# Patient Record
Sex: Female | Born: 1970 | Race: White | Hispanic: No | Marital: Single | Smoking: Never smoker
Health system: Southern US, Community
[De-identification: ages and names within clinical notes are randomized; demographics above are authoritative.]

---

## 2019-08-22 ENCOUNTER — Ambulatory Visit: Payer: Self-pay

## 2019-09-16 ENCOUNTER — Ambulatory Visit: Payer: Self-pay

## 2020-02-06 ENCOUNTER — Other Ambulatory Visit: Payer: Self-pay | Admitting: Family Medicine

## 2020-02-06 DIAGNOSIS — Z1231 Encounter for screening mammogram for malignant neoplasm of breast: Secondary | ICD-10-CM

## 2020-03-16 ENCOUNTER — Ambulatory Visit
Admission: RE | Admit: 2020-03-16 | Discharge: 2020-03-16 | Disposition: A | Discharge status: Home / Self Care | Payer: BC Managed Care – PPO | Source: Ambulatory Visit | Attending: Family Medicine | Admitting: Family Medicine

## 2020-03-16 ENCOUNTER — Other Ambulatory Visit: Payer: Self-pay

## 2020-03-16 DIAGNOSIS — Z1231 Encounter for screening mammogram for malignant neoplasm of breast: Secondary | ICD-10-CM

## 2020-03-31 ENCOUNTER — Emergency Department: Payer: BC Managed Care – PPO

## 2020-03-31 ENCOUNTER — Encounter: Payer: Self-pay | Admitting: Emergency Medicine

## 2020-03-31 ENCOUNTER — Other Ambulatory Visit: Payer: Self-pay

## 2020-03-31 ENCOUNTER — Emergency Department
Admission: EM | Admit: 2020-03-31 | Discharge: 2020-04-01 | Disposition: A | Discharge status: Home / Self Care | Payer: BC Managed Care – PPO | Attending: Emergency Medicine | Admitting: Emergency Medicine

## 2020-03-31 DIAGNOSIS — M542 Cervicalgia: Secondary | ICD-10-CM | POA: Diagnosis not present

## 2020-03-31 DIAGNOSIS — M25511 Pain in right shoulder: Secondary | ICD-10-CM | POA: Diagnosis not present

## 2020-03-31 DIAGNOSIS — R0789 Other chest pain: Secondary | ICD-10-CM | POA: Insufficient documentation

## 2020-03-31 DIAGNOSIS — M79601 Pain in right arm: Secondary | ICD-10-CM | POA: Diagnosis not present

## 2020-03-31 DIAGNOSIS — R079 Chest pain, unspecified: Secondary | ICD-10-CM

## 2020-03-31 LAB — BASIC METABOLIC PANEL
Anion gap: 9 (ref 5–15)
BUN: 16 mg/dL (ref 6–20)
CO2: 27 mmol/L (ref 22–32)
Calcium: 8.8 mg/dL — ABNORMAL LOW (ref 8.9–10.3)
Chloride: 103 mmol/L (ref 98–111)
Creatinine, Ser: 1.06 mg/dL — ABNORMAL HIGH (ref 0.44–1.00)
GFR, Estimated: >60 mL/min (ref >60)
Glucose, Bld: 122 mg/dL — ABNORMAL HIGH (ref 70–99)
Potassium: 4 mmol/L (ref 3.5–5.1)
Sodium: 139 mmol/L (ref 135–145)

## 2020-03-31 LAB — CBC
HCT: 39.8 % (ref 36.0–46.0)
Hemoglobin: 13.2 g/dL (ref 12.0–15.0)
MCH: 29.7 pg (ref 26.0–34.0)
MCHC: 33.2 g/dL (ref 30.0–36.0)
MCV: 89.6 fL (ref 80.0–100.0)
Platelets: 249 10*3/uL (ref 150–400)
RBC: 4.44 MIL/uL (ref 3.87–5.11)
RDW: 14 % (ref 11.5–15.5)
WBC: 8.5 10*3/uL (ref 4.0–10.5)
nRBC: 0 % (ref 0.0–0.2)

## 2020-03-31 LAB — TROPONIN I (HIGH SENSITIVITY): Troponin I (High Sensitivity): 3 ng/L (ref <18)

## 2020-03-31 NOTE — ED Provider Notes (Signed)
[facility] Emergency Department Provider Note   ____________________________________________   Event Date/Time   First MD Initiated Contact with Patient 03/31/20 2308     (approximate)  I have reviewed the triage vital signs and the nursing notes.   HISTORY  Chief Complaint Chest Pain    HPI Diane Santiago is a 49 y.o. female with no significant past medical history presents to the ED complaining of chest pain. Patient reports that she has been feeling malaised for the past couple of days after receiving her COVID-19 booster 3 days ago. She reports some pain in her right arm as well as along her right shoulder and into the right side of her neck. She noticed some swelling there but denies any rashes. Earlier tonight, she developed pain in the right side of her chest, which she states is worse when she takes a deep breath. This is been associated with some mild difficulty breathing, but she denies any fevers or cough. She had some diarrhea earlier this week, denies any abdominal pain or vomiting.        History reviewed. No pertinent past medical history.  There are no problems to display for this patient.   History reviewed. No pertinent surgical history.  Prior to Admission medications   Not on File    Allergies Sulfur  Family History  Problem Relation Age of Onset   Breast cancer Cousin     Social History Social History   Tobacco Use   Smoking status: Never Smoker   Smokeless tobacco: Never Used  Substance Use Topics   Alcohol use: Never    Comment: rarely   Drug use: Never    Review of Systems  Constitutional: No fever/chills Eyes: No visual changes. ENT: No sore throat. Cardiovascular: Positive for chest pain. Respiratory: Positive for shortness of breath. Gastrointestinal: No abdominal pain.  No nausea, no vomiting.  No diarrhea.  No constipation. Genitourinary: Negative for dysuria. Musculoskeletal: Negative for  back pain. Skin: Negative for rash. Neurological: Negative for headaches, focal weakness or numbness.  ____________________________________________   PHYSICAL EXAM:  VITAL SIGNS: ED Triage Vitals  Enc Vitals Group     BP 03/31/20 2111 (!) 148/77     Pulse Rate 03/31/20 2111 79     Resp 03/31/20 2111 16     Temp 03/31/20 2111 98.7 F (37.1 C)     Temp Source 03/31/20 2111 Oral     SpO2 03/31/20 2111 99 %     Weight 03/31/20 2112 268 lb (121.6 kg)     Height 03/31/20 2112 5\' 3"  (1.6 m)     Head Circumference --      Peak Flow --      Pain Score 03/31/20 2112 3     Pain Loc --      Pain Edu? --      Excl. in GC? --     Constitutional: Alert and oriented. Eyes: Conjunctivae are normal. Head: Atraumatic. Nose: No congestion/rhinnorhea. Mouth/Throat: Mucous membranes are moist. Neck: Normal ROM. No midline cervical spine or lateral neck tenderness. No cervical lymphadenopathy noted. Cardiovascular: Normal rate, regular rhythm. Grossly normal heart sounds. Respiratory: Normal respiratory effort.  No retractions. Lungs CTAB. No chest wall tenderness to palpation. Gastrointestinal: Soft and nontender. No distention. Genitourinary: deferred Musculoskeletal: No lower extremity tenderness nor edema. Neurologic:  Normal speech and language. No gross focal neurologic deficits are appreciated. Skin:  Skin is warm, dry and intact. No rash noted. Psychiatric: Mood and affect are  normal. Speech and behavior are normal.  ____________________________________________   LABS (all labs ordered are listed, but only abnormal results are displayed)  Labs Reviewed  BASIC METABOLIC PANEL - Abnormal; Notable for the following components:      Result Value   Glucose, Bld 122 (*)    Creatinine, Ser 1.06 (*)    Calcium 8.8 (*)    All other components within normal limits  FIBRIN DERIVATIVES D-DIMER ([facility] ONLY) - Abnormal; Notable for the following components:   Fibrin derivatives D-dimer  ([facility]) 524.72 (*)    All other components within normal limits  CBC  POC URINE PREG, ED  TROPONIN I (HIGH SENSITIVITY)  TROPONIN I (HIGH SENSITIVITY)   ____________________________________________  EKG  ED ECG REPORT I, Chesley Noon, the attending physician, personally viewed and interpreted this ECG.   Date: 03/31/2020  EKG Time: 21:00  Rate: 75  Rhythm: normal sinus rhythm  Axis: Normal  Intervals:none  ST&T Change: None   PROCEDURES  Procedure(s) performed (including Critical Care):  Procedures   ____________________________________________   INITIAL IMPRESSION / ASSESSMENT AND PLAN / ED COURSE       49 year old female with no significant past medical history presents to the ED complaining of a couple of days of malaise with right arm pain after receiving her COVID-19 booster, subsequently developed pleuritic chest pain this evening. Right side of her neck is normal in appearance with no tenderness. EKG shows no evidence of arrhythmia or ischemia and initial troponin is negative. Symptoms are atypical for ACS but could represent PE. She is low risk by Wells and we will further assess with D-dimer. Chest x-ray reviewed by me and shows no infiltrate, edema, or effusion. If work-up for PE is negative, symptoms are likely musculoskeletal in origin.  CTA negative for PE or other acute process.  Patient is appropriate for discharge home with PCP follow-up.  Patient was counseled to return to the ED for new or worsening symptoms, patient agrees with plan.      ____________________________________________   FINAL CLINICAL IMPRESSION(S) / ED DIAGNOSES  Final diagnoses:  Nonspecific chest pain     ED Discharge Orders    None       Note:  This document was prepared using Dragon voice recognition software and may include unintentional dictation errors.   Chesley Noon, MD 04/01/20 [phone]

## 2020-03-31 NOTE — ED Triage Notes (Signed)
Pt to ED from home c/o right side chest pain radiating to right neck, some SOB.  States got COVID booster Sunday and since then has noticed symptoms along with some swelling to right shoulder.  Patient states diarrhea times a couple episodes, denies n/v or urinary symptoms.  Patient A&Ox4, chest rise even and unlabored, skin WNL, in NAD at this time.

## 2020-04-01 ENCOUNTER — Emergency Department: Payer: BC Managed Care – PPO

## 2020-04-01 LAB — TROPONIN I (HIGH SENSITIVITY): Troponin I (High Sensitivity): 3 ng/L (ref <18)

## 2020-04-01 LAB — FIBRIN DERIVATIVES D-DIMER (ARMC ONLY): Fibrin derivatives D-dimer (ARMC): 524.72 ng/mL (FEU) — ABNORMAL HIGH (ref 0.00–499.00)

## 2020-04-01 MED ORDER — IOHEXOL 350 MG/ML SOLN
100.0000 mL | Freq: Once | INTRAVENOUS | Status: AC | PRN
  Administered 2020-04-01: 02:00:00 100 mL via INTRAVENOUS

## 2021-02-09 ENCOUNTER — Other Ambulatory Visit: Payer: Self-pay | Admitting: Family Medicine

## 2021-02-09 DIAGNOSIS — Z1231 Encounter for screening mammogram for malignant neoplasm of breast: Secondary | ICD-10-CM

## 2021-03-17 ENCOUNTER — Ambulatory Visit
Admission: RE | Admit: 2021-03-17 | Discharge: 2021-03-17 | Disposition: A | Discharge status: Home / Self Care | Payer: BC Managed Care – PPO | Source: Ambulatory Visit | Attending: Family Medicine | Admitting: Family Medicine

## 2021-03-17 ENCOUNTER — Other Ambulatory Visit: Payer: Self-pay

## 2021-03-17 DIAGNOSIS — Z1231 Encounter for screening mammogram for malignant neoplasm of breast: Secondary | ICD-10-CM | POA: Diagnosis present

## 2021-03-23 ENCOUNTER — Other Ambulatory Visit: Payer: Self-pay | Admitting: Family Medicine

## 2021-03-23 DIAGNOSIS — N6320 Unspecified lump in the left breast, unspecified quadrant: Secondary | ICD-10-CM

## 2021-03-23 DIAGNOSIS — R928 Other abnormal and inconclusive findings on diagnostic imaging of breast: Secondary | ICD-10-CM

## 2021-04-06 ENCOUNTER — Ambulatory Visit
Admission: RE | Admit: 2021-04-06 | Discharge: 2021-04-06 | Disposition: A | Discharge status: Home / Self Care | Payer: BC Managed Care – PPO | Source: Ambulatory Visit | Attending: Family Medicine | Admitting: Family Medicine

## 2021-04-06 ENCOUNTER — Other Ambulatory Visit: Payer: Self-pay

## 2021-04-06 DIAGNOSIS — N6320 Unspecified lump in the left breast, unspecified quadrant: Secondary | ICD-10-CM

## 2021-04-06 DIAGNOSIS — R928 Other abnormal and inconclusive findings on diagnostic imaging of breast: Secondary | ICD-10-CM | POA: Insufficient documentation

## 2021-04-07 ENCOUNTER — Other Ambulatory Visit: Payer: Self-pay | Admitting: Family Medicine

## 2021-04-07 DIAGNOSIS — R928 Other abnormal and inconclusive findings on diagnostic imaging of breast: Secondary | ICD-10-CM

## 2021-04-07 DIAGNOSIS — N6320 Unspecified lump in the left breast, unspecified quadrant: Secondary | ICD-10-CM

## 2021-04-21 ENCOUNTER — Ambulatory Visit
Admission: RE | Admit: 2021-04-21 | Discharge: 2021-04-21 | Disposition: A | Discharge status: Home / Self Care | Payer: BC Managed Care – PPO | Source: Ambulatory Visit | Attending: Family Medicine | Admitting: Family Medicine

## 2021-04-21 ENCOUNTER — Other Ambulatory Visit: Payer: Self-pay

## 2021-04-21 DIAGNOSIS — R928 Other abnormal and inconclusive findings on diagnostic imaging of breast: Secondary | ICD-10-CM | POA: Insufficient documentation

## 2021-04-21 DIAGNOSIS — N6320 Unspecified lump in the left breast, unspecified quadrant: Secondary | ICD-10-CM | POA: Diagnosis present

## 2021-04-21 HISTORY — PX: MM BREAST STEREO BIOPSY LEFT (ARMC HX): HXRAD1824

## 2021-04-22 LAB — SURGICAL PATHOLOGY

## 2023-04-05 ENCOUNTER — Other Ambulatory Visit: Payer: Self-pay | Admitting: Family Medicine

## 2023-04-05 DIAGNOSIS — Z1231 Encounter for screening mammogram for malignant neoplasm of breast: Secondary | ICD-10-CM

## 2023-04-26 ENCOUNTER — Ambulatory Visit
Admission: RE | Admit: 2023-04-26 | Discharge: 2023-04-26 | Disposition: A | Discharge status: Home / Self Care | Payer: BLUE CROSS/BLUE SHIELD | Source: Ambulatory Visit | Attending: Family Medicine | Admitting: Family Medicine

## 2023-04-26 DIAGNOSIS — Z1231 Encounter for screening mammogram for malignant neoplasm of breast: Secondary | ICD-10-CM | POA: Insufficient documentation

## 2023-05-07 ENCOUNTER — Other Ambulatory Visit: Payer: Self-pay

## 2023-05-07 ENCOUNTER — Emergency Department: Payer: BLUE CROSS/BLUE SHIELD

## 2023-05-07 DIAGNOSIS — R0789 Other chest pain: Secondary | ICD-10-CM | POA: Diagnosis present

## 2023-05-07 DIAGNOSIS — R110 Nausea: Secondary | ICD-10-CM | POA: Insufficient documentation

## 2023-05-07 LAB — BASIC METABOLIC PANEL
Anion gap: 9 (ref 5–15)
BUN: 18 mg/dL (ref 6–20)
CO2: 26 mmol/L (ref 22–32)
Calcium: 9.5 mg/dL (ref 8.9–10.3)
Chloride: 103 mmol/L (ref 98–111)
Creatinine, Ser: 0.87 mg/dL (ref 0.44–1.00)
GFR, Estimated: >60 mL/min (ref >60)
Glucose, Bld: 121 mg/dL — ABNORMAL HIGH (ref 70–99)
Potassium: 3.7 mmol/L (ref 3.5–5.1)
Sodium: 138 mmol/L (ref 135–145)

## 2023-05-07 LAB — CBC
HCT: 33.9 % — ABNORMAL LOW (ref 36.0–46.0)
Hemoglobin: 10.7 g/dL — ABNORMAL LOW (ref 12.0–15.0)
MCH: 25.5 pg — ABNORMAL LOW (ref 26.0–34.0)
MCHC: 31.6 g/dL (ref 30.0–36.0)
MCV: 80.9 fL (ref 80.0–100.0)
Platelets: 272 10*3/uL (ref 150–400)
RBC: 4.19 MIL/uL (ref 3.87–5.11)
RDW: 16.4 % — ABNORMAL HIGH (ref 11.5–15.5)
WBC: 8.8 10*3/uL (ref 4.0–10.5)
nRBC: 0 % (ref 0.0–0.2)

## 2023-05-07 LAB — TROPONIN I (HIGH SENSITIVITY): Troponin I (High Sensitivity): 6 ng/L (ref <18)

## 2023-05-07 LAB — HEPATIC FUNCTION PANEL
ALT: 17 U/L (ref 0–44)
AST: 17 U/L (ref 15–41)
Albumin: 3.5 g/dL (ref 3.5–5.0)
Alkaline Phosphatase: 87 U/L (ref 38–126)
Bilirubin, Direct: <0.1 mg/dL (ref 0.0–0.2)
Total Bilirubin: 0.3 mg/dL (ref 0.0–1.2)
Total Protein: 7.2 g/dL (ref 6.5–8.1)

## 2023-05-07 LAB — LIPASE, BLOOD: Lipase: 37 U/L (ref 11–51)

## 2023-05-07 NOTE — ED Triage Notes (Addendum)
Pt reports intermittent chest tightness that began 3 days ago, pt states she is also having tingling sensation in her chest. Pt states she has had headache and nausea associated with pain . Pt denies cough congestion fever. Pt denies cardiac hx.

## 2023-05-08 ENCOUNTER — Emergency Department
Admission: EM | Admit: 2023-05-08 | Discharge: 2023-05-08 | Disposition: A | Discharge status: Home / Self Care | Payer: BLUE CROSS/BLUE SHIELD | Attending: Emergency Medicine | Admitting: Emergency Medicine

## 2023-05-08 DIAGNOSIS — R079 Chest pain, unspecified: Secondary | ICD-10-CM

## 2023-05-08 LAB — TROPONIN I (HIGH SENSITIVITY): Troponin I (High Sensitivity): 6 ng/L (ref <18)

## 2023-05-08 LAB — POC URINE PREG, ED: Preg Test, Ur: NEGATIVE

## 2023-05-08 MED ORDER — ALUM & MAG HYDROXIDE-SIMETH 200-200-20 MG/5ML PO SUSP
30.0000 mL | Freq: Once | ORAL | Status: AC
Start: 2023-05-08 — End: 2023-05-08
  Administered 2023-05-08: 30 mL via ORAL
  Filled 2023-05-08: qty 30

## 2023-05-08 MED ORDER — LIDOCAINE VISCOUS HCL 2 % MT SOLN
15.0000 mL | Freq: Once | OROMUCOSAL | Status: AC
  Administered 2023-05-08: 15 mL via OROMUCOSAL
  Filled 2023-05-08: qty 15

## 2023-05-08 MED ORDER — PANTOPRAZOLE SODIUM 40 MG PO TBEC
40.0000 mg | DELAYED_RELEASE_TABLET | Freq: Once | ORAL | Status: AC
Start: 2023-05-08 — End: 2023-05-08
  Administered 2023-05-08: 40 mg via ORAL
  Filled 2023-05-08: qty 1

## 2023-05-08 NOTE — ED Provider Notes (Signed)
[facility] Provider Note    Event Date/Time   First MD Initiated Contact with Patient 05/08/23 [phone]     (approximate)   History   Chest Pain   HPI  Diane Santiago is a 53 y.o. female with history of obesity, family history of premature CAD who presents to the emergency department with intermittent chest tightness with waves of nausea ongoing for the past several days.  She notices symptoms after eating pizza and drinking Ovaltine.  She states when she exerts herself she does not develop symptoms.  No associated shortness of breath but states that she feels like sometimes she needs to take a deep breath.  Pain is not pleuritic in nature.  No calf tenderness or calf swelling.  No fevers or cough.  No diaphoresis, dizziness, syncope.  Has never seen a cardiologist.  States her father had a bypass in his 30s.  She denies any history of hypertension, diabetes or hyperlipidemia.  She has never been a smoker.   History provided by patient, significant other.    History reviewed. No pertinent past medical history.  Past Surgical History:  Procedure Laterality Date   MM BREAST STEREO BIOPSY LEFT ([facility] HX) Left 04/21/2021   stereo, mass, ribbon clip, path pend    MEDICATIONS:  Prior to Admission medications   Not on File    Physical Exam   Triage Vital Signs: ED Triage Vitals  Encounter Vitals Group     BP 05/07/23 2211 (!) 144/80     Systolic BP Percentile --      Diastolic BP Percentile --      Pulse Rate 05/07/23 2211 84     Resp 05/07/23 2211 18     Temp 05/07/23 2211 98 F (36.7 C)     Temp Source 05/07/23 2211 Oral     SpO2 05/07/23 2211 100 %     Weight 05/07/23 2209 278 lb (126.1 kg)     Height 05/07/23 2209 5\' 3"  (1.6 m)     Head Circumference --      Peak Flow --      Pain Score 05/07/23 2209 5     Pain Loc --      Pain Education --      Exclude from Growth Chart --     Most recent vital signs: Vitals:   05/08/23 0436 05/08/23  0551  BP: (!) 162/99 136/74  Pulse: 87 67  Resp: 18 18  Temp:  98.2 F (36.8 C)  SpO2: 100% 100%    CONSTITUTIONAL: Alert, responds appropriately to questions. Well-appearing; well-nourished HEAD: Normocephalic, atraumatic EYES: Conjunctivae clear, pupils appear equal, sclera nonicteric ENT: normal nose; moist mucous membranes NECK: Supple, normal ROM CARD: RRR; S1 and S2 appreciated RESP: Normal chest excursion without splinting or tachypnea; breath sounds clear and equal bilaterally; no wheezes, no rhonchi, no rales, no hypoxia or respiratory distress, speaking full sentences ABD/GI: Non-distended; soft, non-tender, no rebound, no guarding, no peritoneal signs BACK: The back appears normal EXT: Normal ROM in all joints; no deformity noted, no edema, no calf tenderness or calf swelling SKIN: Normal color for age and race; warm; no rash on exposed skin NEURO: Moves all extremities equally, normal speech PSYCH: The patient's mood and manner are appropriate.   ED Results / Procedures / Treatments   LABS: (all labs ordered are listed, but only abnormal results are displayed) Labs Reviewed  BASIC METABOLIC PANEL - Abnormal; Notable for the following components:  Result Value   Glucose, Bld 121 (*)    All other components within normal limits  CBC - Abnormal; Notable for the following components:   Hemoglobin 10.7 (*)    HCT 33.9 (*)    MCH 25.5 (*)    RDW 16.4 (*)    All other components within normal limits  POC URINE PREG, ED - Normal  HEPATIC FUNCTION PANEL  LIPASE, BLOOD  TROPONIN I (HIGH SENSITIVITY)  TROPONIN I (HIGH SENSITIVITY)     EKG:  EKG Interpretation Date/Time:  Monday May 07 2023 22:11:30 EST Ventricular Rate:  76 PR Interval:  156 QRS Duration:  80 QT Interval:  364 QTC Calculation: 409 R Axis:   13  Text Interpretation: Normal sinus rhythm Cannot rule out Anterior infarct (cited on or before 31-Mar-2020) Abnormal ECG When compared with ECG  of 31-Mar-2020 21:00, No significant change was found Confirmed by Rochele Raring [phone]) on 05/08/2023 4:10:15 AM         RADIOLOGY: My personal review and interpretation of imaging: Chest x-ray clear.  I have personally reviewed all radiology reports.   DG Chest 2 View Result Date: 05/07/2023 CLINICAL DATA:  Intermittent chest pain for 3 days, headaches and nausea EXAM: CHEST - 2 VIEW COMPARISON:  03/31/2020 FINDINGS: The heart size and mediastinal contours are within normal limits. Both lungs are clear. The visualized skeletal structures are unremarkable. IMPRESSION: No active cardiopulmonary disease. Electronically Signed   By: Sharlet Salina M.D.   On: 05/07/2023 22:51     PROCEDURES:  Critical Care performed: No      Procedures    IMPRESSION / MDM / ASSESSMENT AND PLAN / ED COURSE  I reviewed the triage vital signs and the nursing notes.    Patient here with complaints of chest pain.  Symptoms seem to worsen after eating.  They are not exertional or pleuritic.     DIFFERENTIAL DIAGNOSIS (includes but not limited to):   Esophagitis, esophageal spasm, GERD, less likely ACS, doubt PE or dissection, pneumonia, pneumothorax, CHF   Patient's presentation is most consistent with acute presentation with potential threat to life or bodily function.   PLAN: Labs obtained from triage show normal LFTs, lipase.  Troponin x 2 negative.  Chest x-ray reviewed and interpreted by myself and radiologist and is clear.  EKG shows no ischemic change.  Will try GI cocktail, Protonix and reassess.   MEDICATIONS GIVEN IN ED: Medications  alum & mag hydroxide-simeth (MAALOX/MYLANTA) 200-200-20 MG/5ML suspension 30 mL (30 mLs Oral Given 05/08/23 0434)  lidocaine (XYLOCAINE) 2 % viscous mouth solution 15 mL (15 mLs Mouth/Throat Given 05/08/23 0434)  pantoprazole (PROTONIX) EC tablet 40 mg (40 mg Oral Given 05/08/23 0434)     ED COURSE: Reports minimal improvement with GI cocktail.  Have  offered further medication which she declines.  Patient has a heart score of 3.  I feel she is safe for discharge with outpatient follow-up.  Patient seems very anxious about this.  We did discuss the possibility of admission for further monitoring given her concerns but she states she is comfortable with discharge home with cardiology follow-up.  Referral has been placed.  She was worried about her blood pressure but is currently 130s/70s.  I suspect some of her symptoms are exacerbated by anxiety.   At this time, I do not feel there is any life-threatening condition present. I reviewed all nursing notes, vitals, pertinent previous records.  All lab and urine results, EKGs, imaging ordered have been independently  reviewed and interpreted by myself.  I reviewed all available radiology reports from any imaging ordered this visit.  Based on my assessment, I feel the patient is safe to be discharged home without further emergent workup and can continue workup as an outpatient as needed. Discussed all findings, treatment plan as well as usual and customary return precautions.  They verbalize understanding and are comfortable with this plan.  Outpatient follow-up has been provided as needed.  All questions have been answered.    CONSULTS: Offered admission although heart score is only 3 and troponins negative but patient declined.   OUTSIDE RECORDS REVIEWED: No previous records for review.       FINAL CLINICAL IMPRESSION(S) / ED DIAGNOSES   Final diagnoses:  Nonspecific chest pain     Rx / DC Orders   ED Discharge Orders          Ordered    Ambulatory referral to Cardiology       Comments: If you have not heard from the Cardiology office within the next 72 hours please call [phone].   05/08/23 0500             Note:  This document was prepared using Dragon voice recognition software and may include unintentional dictation errors.   Calena Salem, Layla Maw, DO 05/08/23 [phone]

## 2023-08-25 IMAGING — MG MM BREAST BX W LOC DEV 1ST LESION IMAGE BX SPEC STEREO GUIDE*L*
8 series · 8 of 8 positions shown · non-contrast
Comparison: Previous exams.
COMPARISON: Previous exams.

Addendum:
CLINICAL DATA: Patient with indeterminate sonographically occult
left breast mass.

EXAM:
LEFT BREAST STEREOTACTIC CORE NEEDLE BIOPSY

[L (1 of 8)]
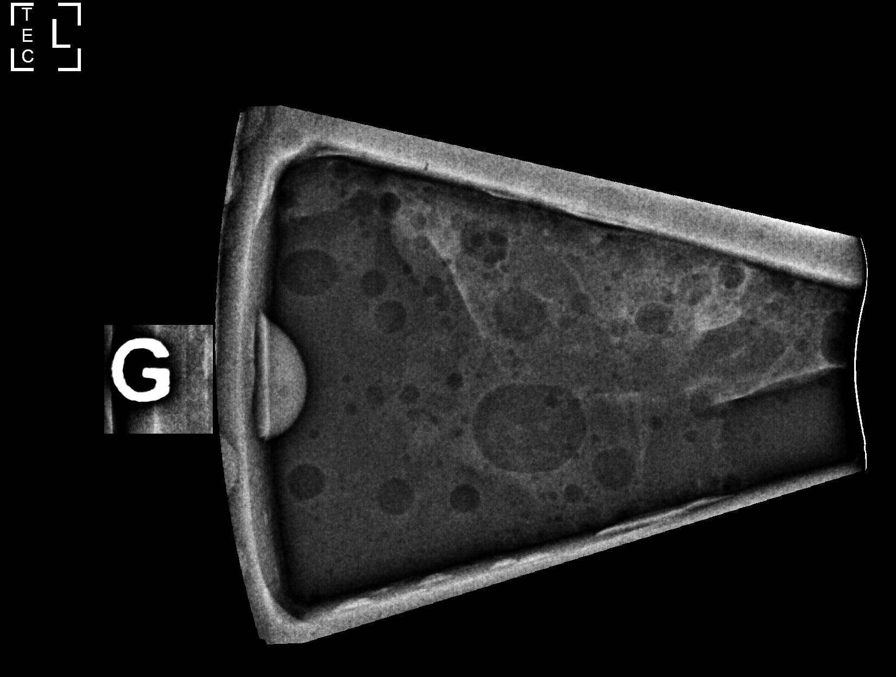

[L (2 of 8)]
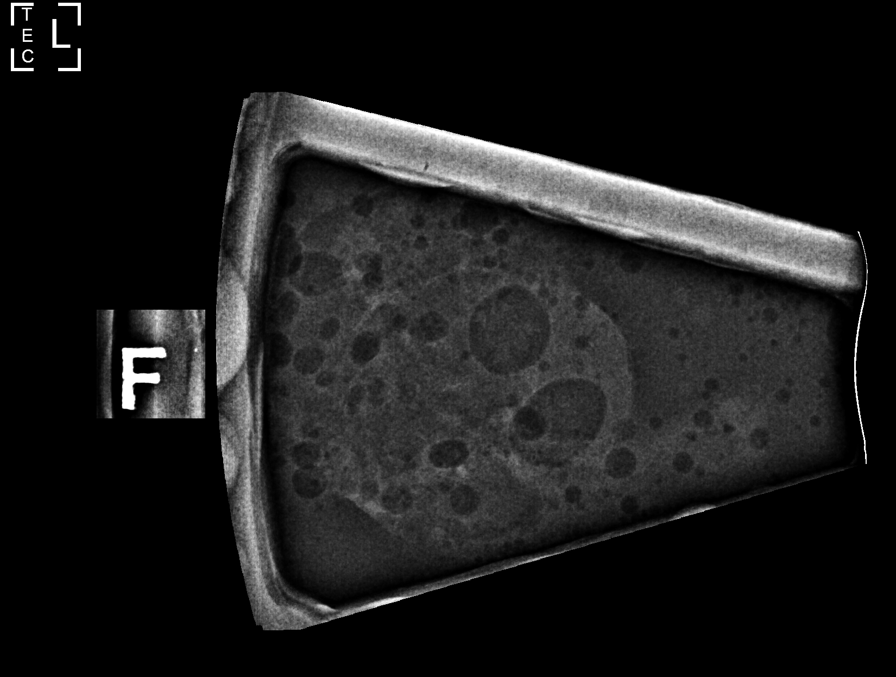

[L (3 of 8)]
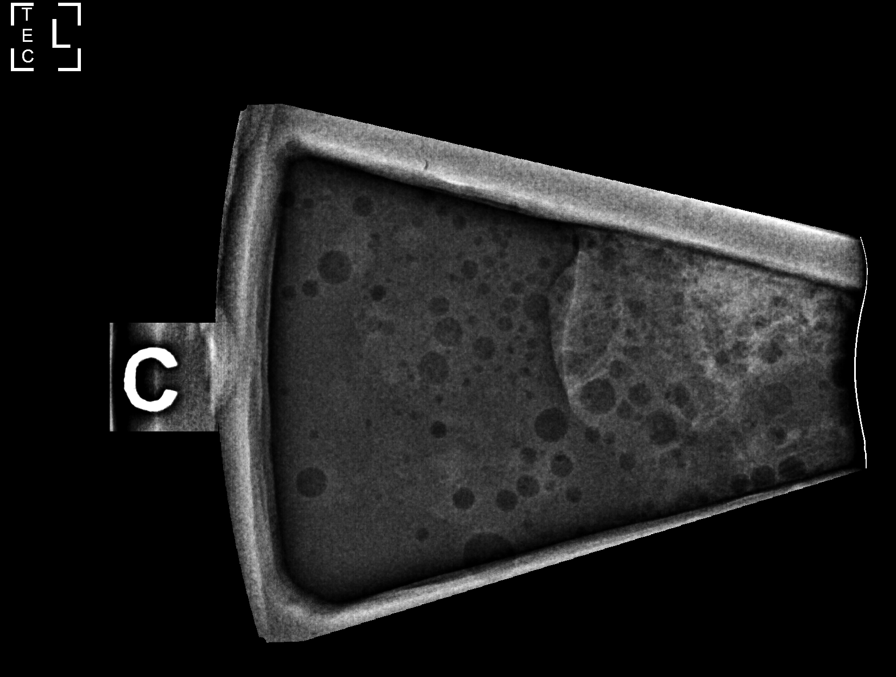

[L (4 of 8)]
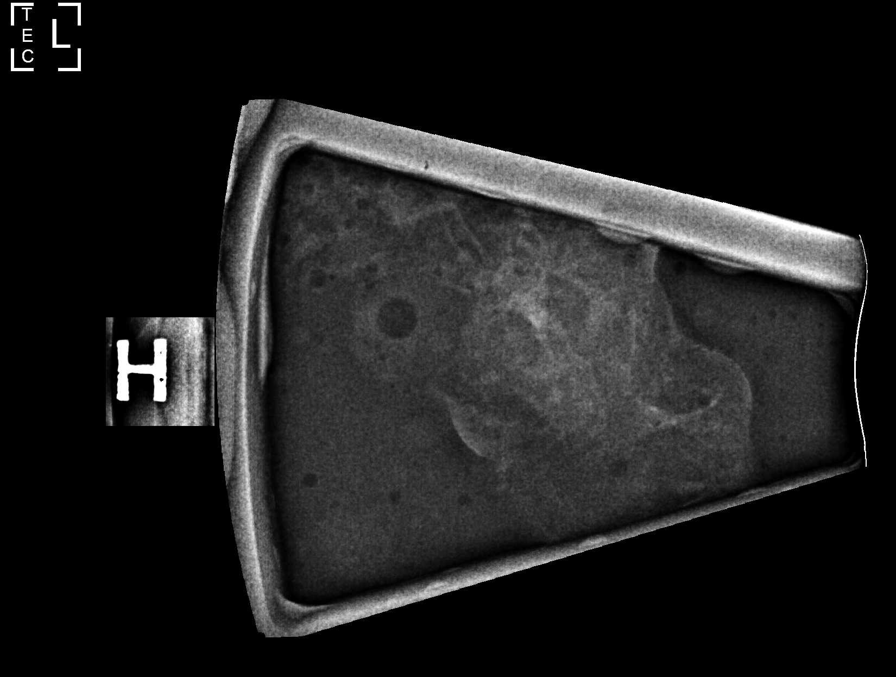

[L (5 of 8)]
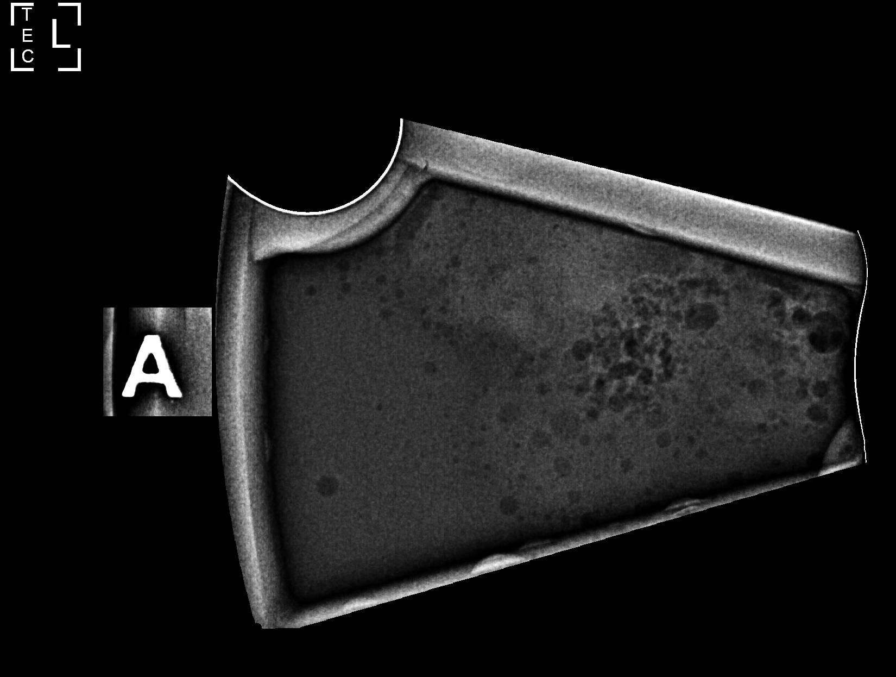

[L (6 of 8)]
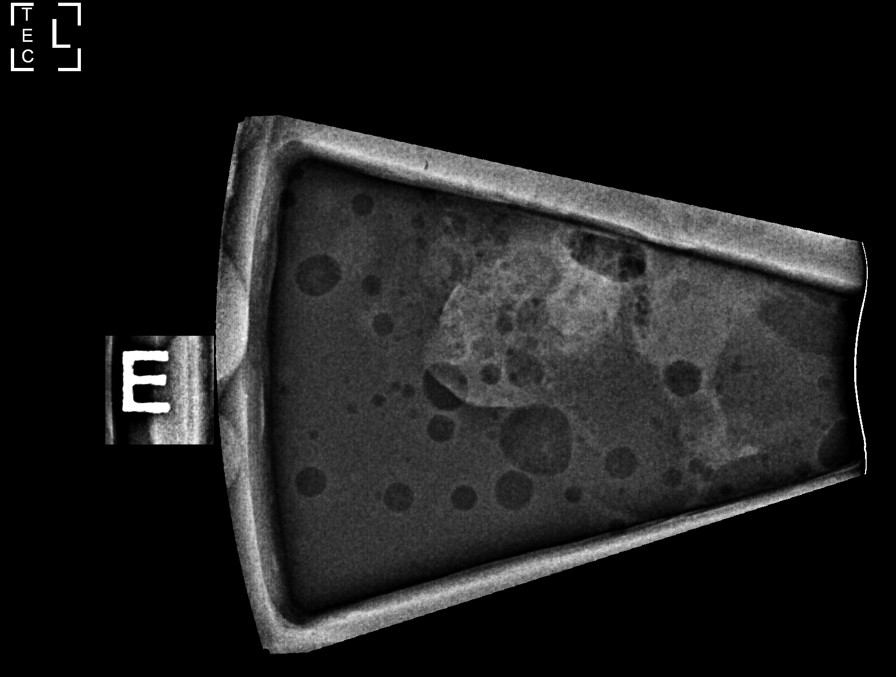

[L (7 of 8)]
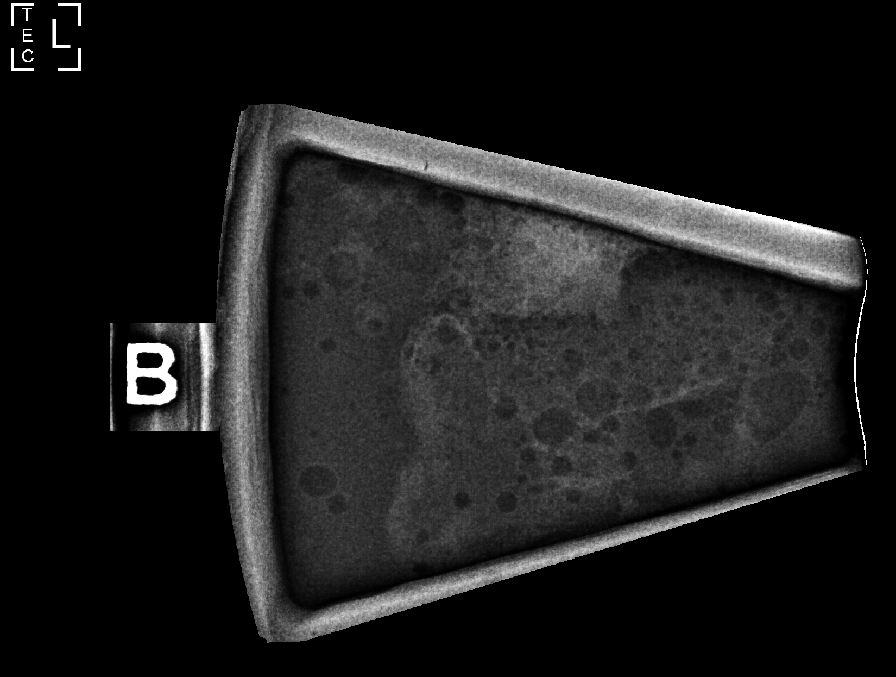

[L (8 of 8)]
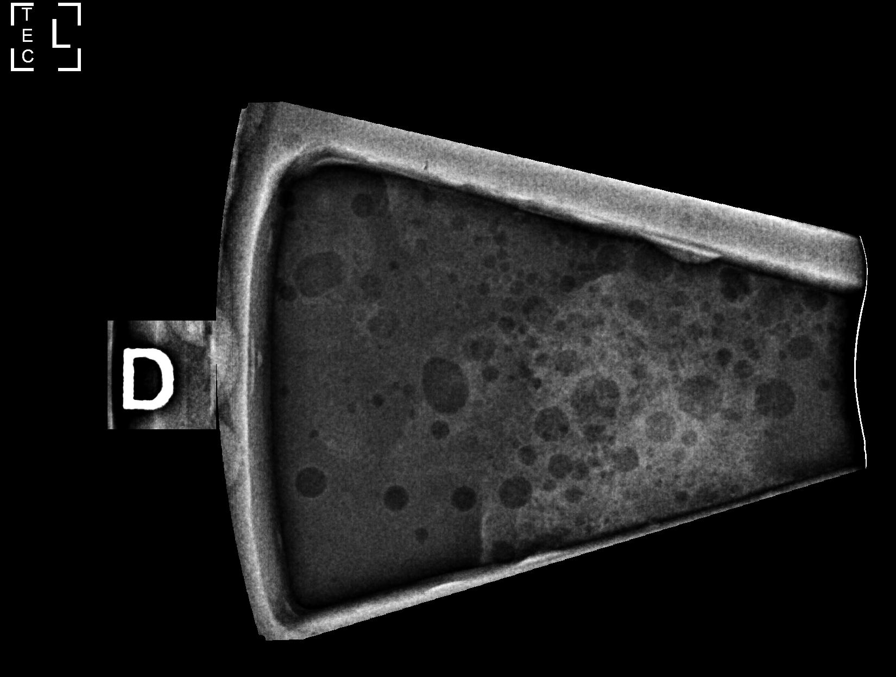

[8 of 8 positions shown; findings below may reference images not displayed]



Using sterile technique and 1% Lidocaine as local anesthetic, under
stereotactic guidance, a 9 gauge vacuum assisted device was used to
perform core needle biopsy of mass within the medial left breast
using a medial approach.

Lesion quadrant: Upper inner quadrant

At the conclusion of the procedure, ribbon shaped tissue marker clip
was deployed into the biopsy cavity. Follow-up 2-view mammogram was
performed and dictated separately.
IMPRESSION: Stereotactic-guided biopsy of left breast mass. No apparent
complications.

ADDENDUM:
PATHOLOGY revealed: A. LEFT BREAST MASS, MEDIAL; STEREOTACTIC
BIOPSY: - FIBROCYSTIC CHANGES WITH APOCRINE METAPLASIA. -
PSEUDOANGIOMATOUS STROMAL HYPERPLASIA (PASH). - NEGATIVE FOR ATYPIA
AND MALIGNANCY.

Pathology results are CONCORDANT with imaging findings, per Dr. Hadden
Xavier.

Pathology results and recommendations were discussed with patient
via telephone on 04/22/2021. Patient reported biopsy site doing well
with no adverse symptoms, and only slight tenderness at the site.
Post biopsy care instructions were reviewed, questions were answered
and my direct phone number was provided. Patient was instructed to
call [[email or web address]] for any additional questions or concerns
related to biopsy site.

RECOMMENDATION: Patient instructed to continue monthly self breast
examinations and resume annual bilateral screening mammogram due
March 2022.

Pathology results reported by Nya Jumper RN on 04/25/2021.



Using sterile technique and 1% Lidocaine as local anesthetic, under
stereotactic guidance, a 9 gauge vacuum assisted device was used to
perform core needle biopsy of mass within the medial left breast
using a medial approach.

Lesion quadrant: Upper inner quadrant

At the conclusion of the procedure, ribbon shaped tissue marker clip
was deployed into the biopsy cavity. Follow-up 2-view mammogram was
performed and dictated separately.
IMPRESSION: Stereotactic-guided biopsy of left breast mass. No apparent
complications.

## 2024-05-07 ENCOUNTER — Other Ambulatory Visit: Payer: Self-pay | Admitting: Family Medicine

## 2024-05-07 DIAGNOSIS — Z1231 Encounter for screening mammogram for malignant neoplasm of breast: Secondary | ICD-10-CM

## 2024-05-08 ENCOUNTER — Ambulatory Visit
Admission: RE | Admit: 2024-05-08 | Discharge: 2024-05-08 | Disposition: A | Discharge status: Home / Self Care | Source: Ambulatory Visit | Attending: Family Medicine | Admitting: Family Medicine

## 2024-05-08 DIAGNOSIS — Z1231 Encounter for screening mammogram for malignant neoplasm of breast: Secondary | ICD-10-CM | POA: Diagnosis present
